# Patient Record
Sex: Female | Born: 1985 | Race: White | Hispanic: No | Marital: Single
Health system: Southern US, Community
[De-identification: ages and names within clinical notes are randomized; demographics above are authoritative.]

---

## 2009-06-17 ENCOUNTER — Ambulatory Visit: Payer: Self-pay | Admitting: Advanced Practice Midwife

## 2009-07-01 ENCOUNTER — Ambulatory Visit: Payer: Self-pay | Admitting: Obstetrics and Gynecology

## 2009-07-15 ENCOUNTER — Ambulatory Visit: Payer: Self-pay | Admitting: Obstetrics and Gynecology

## 2009-07-17 ENCOUNTER — Ambulatory Visit (HOSPITAL_COMMUNITY): Admission: RE | Admit: 2009-07-17 | Discharge: 2009-07-17 | Payer: Self-pay | Admitting: Obstetrics & Gynecology

## 2009-07-17 IMAGING — US US OB TRANSVAGINAL
1 series · 14 of 25 positions shown · non-contrast
Comparison: none

OBSTETRICAL ULTRASOUND:
 This ultrasound exam was performed in the [HOSPITAL] Ultrasound Department.  The OB US report was generated in the AS system, and faxed to the ordering physician.  This report is also available in [HOSPITAL]?s [employer] and in [REDACTED] PACS.

[Series 1: us ob transvaginal · 14 of 25 slices shown]
[im 1/25]
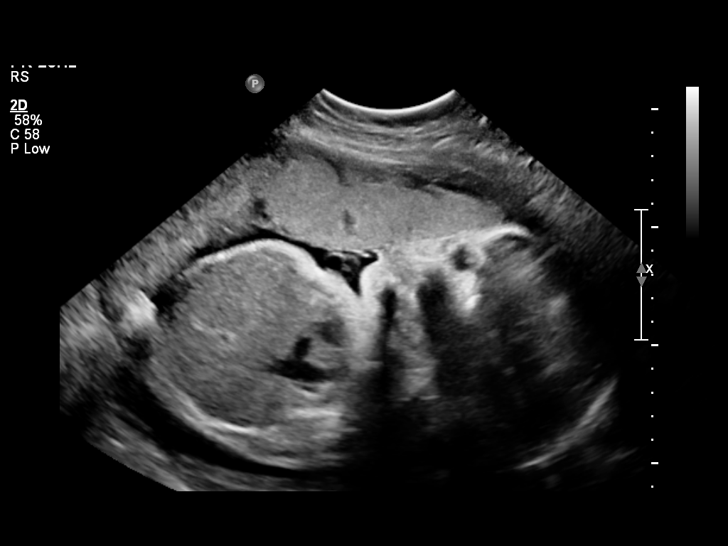
[im 3/25]
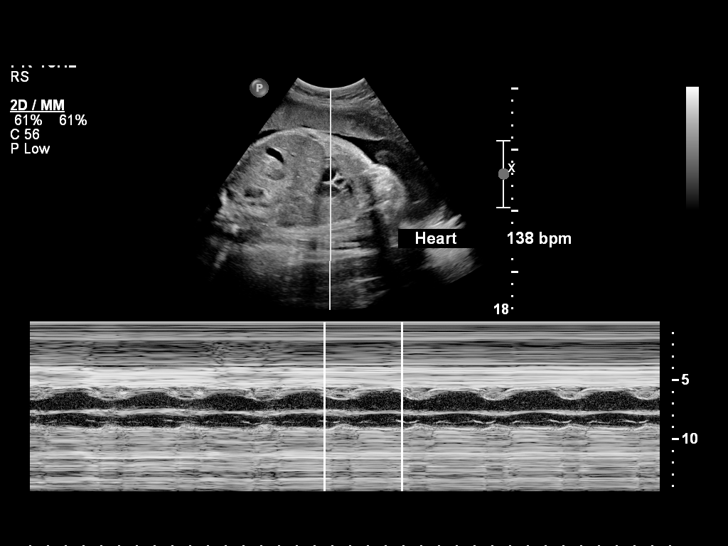
[im 5/25]
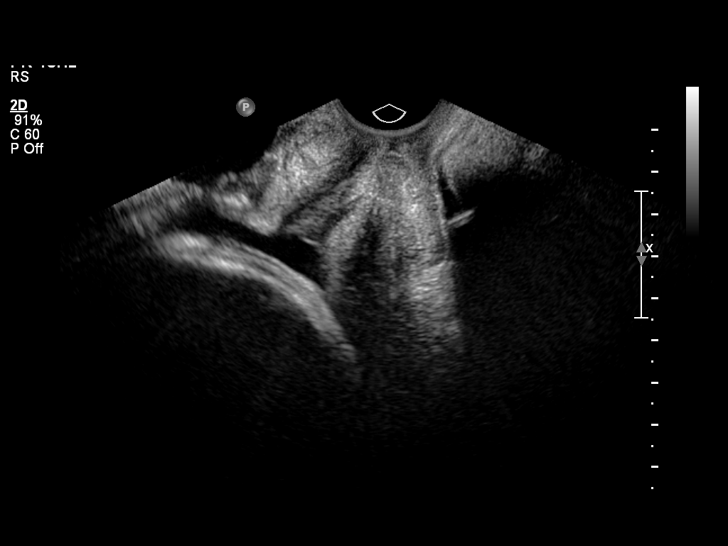
[im 7/25]
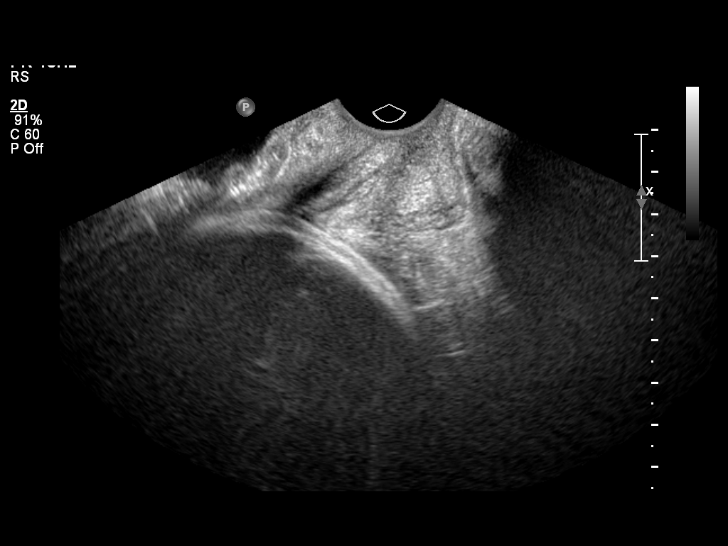
[im 9/25]
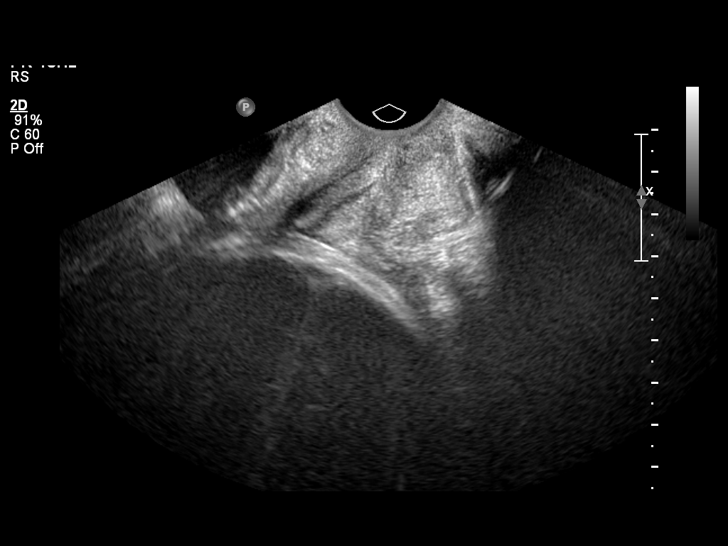
[im 10/25]
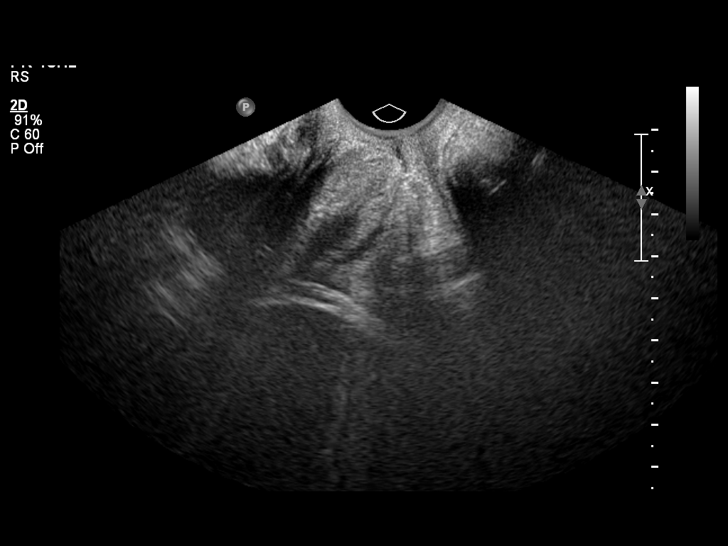
[im 12/25]
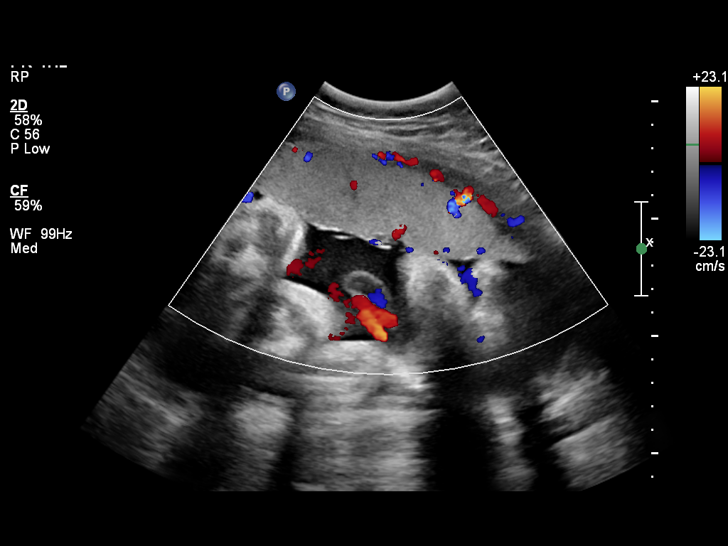
[im 14/25]
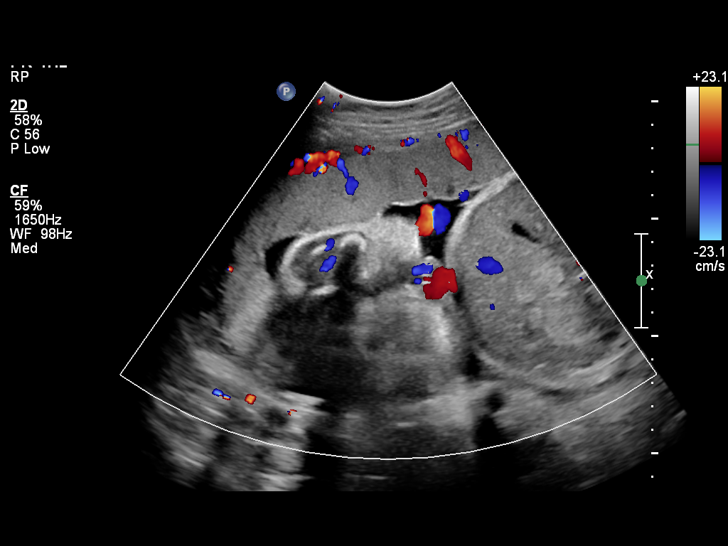
[im 16/25]
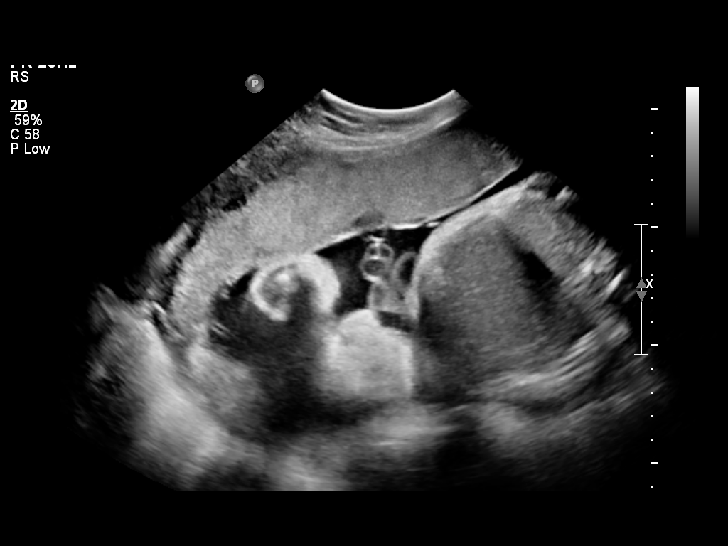
[im 17/25]
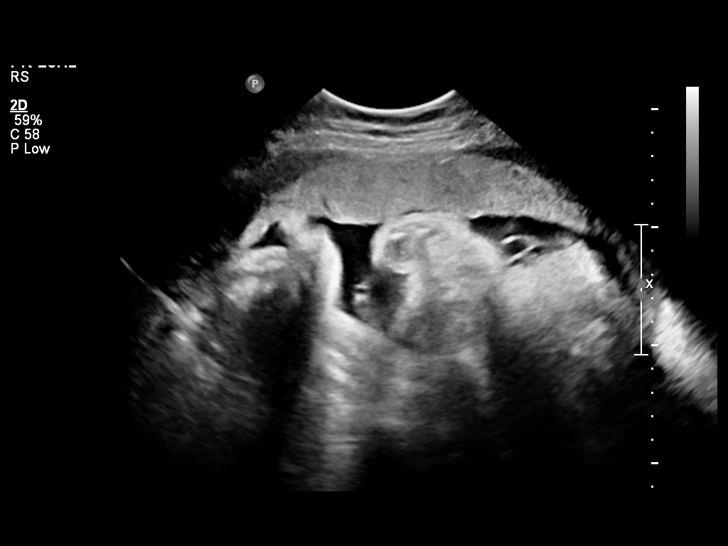
[im 19/25]
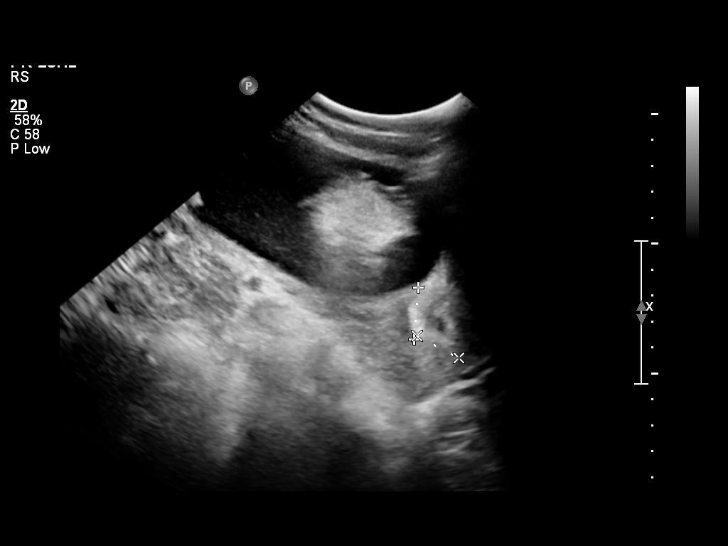
[im 21/25]
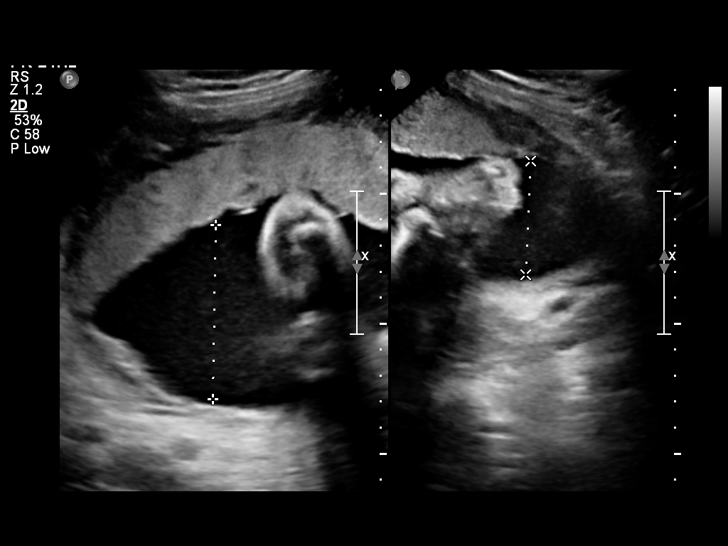
[im 23/25]
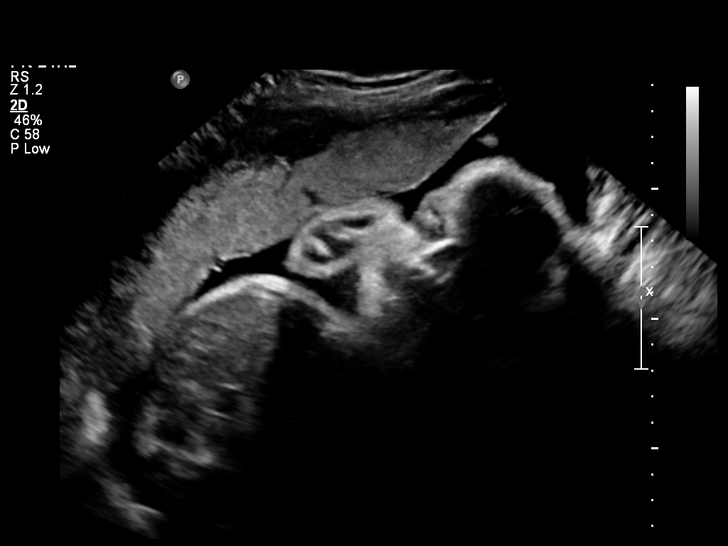
[im 25/25]
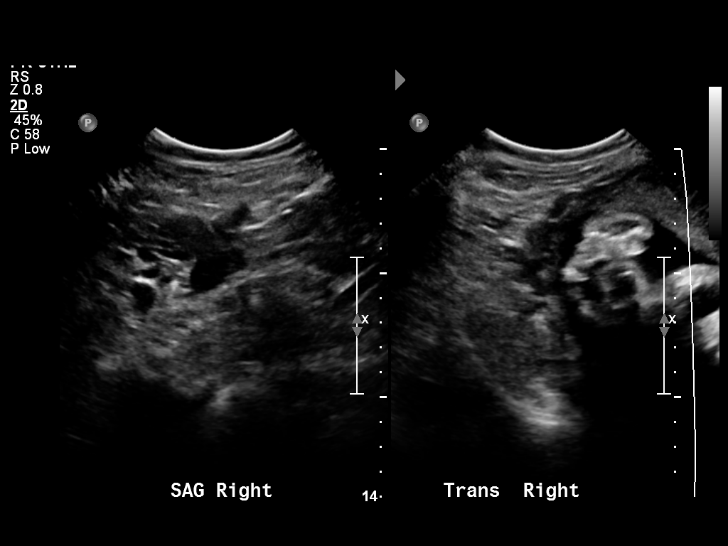

[14 of 25 positions shown; findings below may reference images not displayed]

IMPRESSION: See AS Obstetric US report.

## 2009-07-28 ENCOUNTER — Inpatient Hospital Stay (HOSPITAL_COMMUNITY): Admission: AD | Admit: 2009-07-28 | Discharge: 2009-07-28 | Payer: Self-pay | Admitting: Obstetrics & Gynecology

## 2009-07-30 ENCOUNTER — Ambulatory Visit: Payer: Self-pay | Admitting: Obstetrics & Gynecology

## 2009-08-12 ENCOUNTER — Ambulatory Visit: Payer: Self-pay | Admitting: Physician Assistant

## 2009-08-19 ENCOUNTER — Ambulatory Visit: Payer: Self-pay | Admitting: Family

## 2009-08-19 LAB — CONVERTED CEMR LAB: GC Probe Amp, Urine: NEGATIVE

## 2009-08-26 ENCOUNTER — Ambulatory Visit: Payer: Self-pay | Admitting: Advanced Practice Midwife

## 2009-09-02 ENCOUNTER — Ambulatory Visit: Payer: Self-pay | Admitting: Physician Assistant

## 2009-09-03 ENCOUNTER — Ambulatory Visit: Payer: Self-pay | Admitting: Obstetrics & Gynecology

## 2009-09-03 ENCOUNTER — Inpatient Hospital Stay (HOSPITAL_COMMUNITY): Admission: AD | Admit: 2009-09-03 | Discharge: 2009-09-07 | Payer: Self-pay | Admitting: Family Medicine

## 2009-10-08 ENCOUNTER — Ambulatory Visit: Payer: Self-pay | Admitting: Obstetrics & Gynecology

## 2009-10-09 ENCOUNTER — Encounter: Payer: Self-pay | Admitting: Obstetrics & Gynecology

## 2009-10-21 ENCOUNTER — Other Ambulatory Visit: Admission: RE | Admit: 2009-10-21 | Discharge: 2009-10-21 | Payer: Self-pay | Admitting: Obstetrics & Gynecology

## 2009-10-21 ENCOUNTER — Ambulatory Visit: Payer: Self-pay | Admitting: Obstetrics and Gynecology

## 2009-10-22 ENCOUNTER — Encounter: Payer: Self-pay | Admitting: Obstetrics and Gynecology

## 2009-10-22 LAB — CONVERTED CEMR LAB
HCT: 38.7 % (ref 36.0–46.0)
Hemoglobin: 12.8 g/dL (ref 12.0–15.0)
RDW: 12.5 % (ref 11.5–15.5)
WBC: 7.8 10*3/uL (ref 4.0–10.5)

## 2010-10-27 LAB — CBC
HCT: 21.4 % — ABNORMAL LOW (ref 36.0–46.0)
HCT: 36.2 % (ref 36.0–46.0)
Hemoglobin: 12.2 g/dL (ref 12.0–15.0)
Hemoglobin: 7.2 g/dL — ABNORMAL LOW (ref 12.0–15.0)
Hemoglobin: 8.9 g/dL — ABNORMAL LOW (ref 12.0–15.0)
MCHC: 33.6 g/dL (ref 30.0–36.0)
MCHC: 33.8 g/dL (ref 30.0–36.0)
MCHC: 34 g/dL (ref 30.0–36.0)
MCHC: 34.2 g/dL (ref 30.0–36.0)
MCV: 90.6 fL (ref 78.0–100.0)
MCV: 91.1 fL (ref 78.0–100.0)
MCV: 91.1 fL (ref 78.0–100.0)
MCV: 92.2 fL (ref 78.0–100.0)
Platelets: 157 10*3/uL (ref 150–400)
Platelets: 206 10*3/uL (ref 150–400)
RBC: 2.15 MIL/uL — ABNORMAL LOW (ref 3.87–5.11)
RBC: 2.88 MIL/uL — ABNORMAL LOW (ref 3.87–5.11)
RDW: 12.4 % (ref 11.5–15.5)
RDW: 12.9 % (ref 11.5–15.5)
WBC: 21.6 10*3/uL — ABNORMAL HIGH (ref 4.0–10.5)

## 2010-10-27 LAB — CROSSMATCH

## 2010-11-10 LAB — CBC
Hemoglobin: 11.8 g/dL — ABNORMAL LOW (ref 12.0–15.0)
Platelets: 295 10*3/uL (ref 150–400)
RDW: 12.6 % (ref 11.5–15.5)

## 2010-11-10 LAB — KLEIHAUER-BETKE STAIN
Fetal Cells %: 0 %
Quantitation Fetal Hemoglobin: 0 mL

## 2010-11-10 LAB — ABO/RH: ABO/RH(D): A POS

## 2010-12-23 NOTE — Assessment & Plan Note (Signed)
Sandy Keith, Sandy Keith               ACCOUNT NO.:  [id number]   MEDICAL RECORD NO.:  [id number]          PATIENT TYPE:  POB   LOCATION:  [facility] at [place]         FACILITY:  [facility]   PHYSICIAN:  Elsie Lincoln, MD      DATE OF BIRTH:  January 10, 1986   DATE OF SERVICE:  10/08/2009                                  CLINIC NOTE   The patient is a 26 year old female who presents for her symptoms of  urinary tract infection.  Most of it sounds like pressure and possibly  some urethral irritation.  Dip UA is negative, we will send it for  culture.  She is not really having frequency.  The patient is 4 weeks  postpartum from a long labor.  She also had postpartum endometritis with  an episiotomy.  She had a postpartum hemorrhage and received several  units of packed red blood cells.  The patient is breast feeding and  doing reasonably well with that.  The patient is also complaining of a  rash on her upper extremities.  This appeared during her pregnancy.  She  does not do anything for this.  The patient wants a Dermatology referral  while she has Medicaid.  I will gladly honor this.  I am also going to  prescribe her triamcinolone cream to apply twice a day while she is  waiting for the Dermatology referral.  The patient is still symptomatic.  I will have her come back on Friday.  We will do a detailed speculum  exam, possibly send cultures of the urethra.  Again, the urine is being  sent for culture as well.           ______________________________  Elsie Lincoln, MD     KL/MEDQ  D:  10/08/2009  T:  10/09/2009  Job:  [id number]

## 2010-12-23 NOTE — Assessment & Plan Note (Signed)
NAMEMARJORIE, DEPREY               ACCOUNT NO.:  [id number]   MEDICAL RECORD NO.:  [id number]          PATIENT TYPE:  POB   LOCATION:  [facility] at [facility]         FACILITY:  [facility]   PHYSICIAN:  Caren Griffins, CNM       DATE OF BIRTH:  [birth year]   DATE OF SERVICE:                                  CLINIC NOTE   REASON FOR VISIT:  A 6-week postpartum check.   HISTORY:  Sargun is G1, P1-0-0-1, who had a vacuum-assisted vaginal  delivery on September 05, 2009.  Baby Randie is now doing well on breast  and bottle and weighs nearly 8 pounds.  She had a 2-week stay due to  jaundice.  When she went home, bilirubin was 15; however, the next day,  it spiked up to 24 and the baby was admitted at [facility] for  another week.  Due to this, she has been supplementing with bottle-  feeding as well as breast-feeding the entire time.  Today, she has  questions about why she had the postpartum hemorrhage and is concerned  that that may happen again.  She has had no orthostatic symptoms and is  taking her vitamins and iron supplementation as well.  She has not been  sexually active.  She has seen the baby's father who left her during the  pregnancy and his mother has been very helpful.  She has moved back in  with her mother and stepfather who are also helping her with child care.  She describes nonpruritic rash which consists of scattered small papules  on upper and lower extremities and buttocks, not on the trunk, it is not  pruritic.  She did go to the dermatologist after Dr. Penne Lash referred  her and the diagnosis was keratosis follicularis.  She was told not to  use steroid cream and given another topical cream which she says has not  been effective.  She states that the dermatologist was wanting to do  salicylic acid treatment after she stops breast-feeding.  She is  describing a pinching sensation that is fleeting and located in the area  of her clitoris, it is not related to urinating, and  she has no dysuria  and of note, her urine culture from 2 weeks ago was negative.  She is  not having any pinching or pain now.  She does notice it when she moves  or sometimes when she is straining with urination, but also randomly.  She had a very scanty period of about 3 days last week.  She would like  to go on oral contraceptives.   PHYSICAL EXAMINATION:  VITAL SIGNS:  Blood pressure 117/58, pulse 68,  weight 112.  GENERAL:  Pleasant, in NAD.  BREASTS:  Soft, not inflamed.  ABDOMEN:  Soft, flat, nontender.  PELVIC:  NEFG.  BUS negative.  The urethral meatus is not inflamed.  There is no tenderness in any area with Q-tip pressure.  Her episiotomy  is well healed.  SPECULUM:  Scant physiologic discharge.  Cervix parous, no lesions.  Pap  smear is done, slightly friable with Pap brush with cytobrush.  Uterus,  NSSP, nontender.  No adnexal tenderness or masses.  ASSESSMENT:  1. Normal postpartum.  2. Keratosis follicularis.   PLAN:  CBC is done today.  If her hemoglobin is normal, she can stop  taking the iron supplements.  Prescription given for [facility] OCPs.  Pap  smear sent and records from Dermatology are also sent for.  We discussed  at length her delivery and birth experience, answering all questions and  explaining she had a prolonged labor, putting her at a higher likelihood  of atony in addition to the chorioamnionitis.  There was a small amount  of tissue obtained on manual removal after placenta was delivered.  Also, discussed the baby too was at risk for jaundice due to the vacuum  possibly, and she states the baby's blood type was the same as hers,  both A positive and that the baby was feeding well throughout, so  emphasized that both mother and baby are doing well at this point and  reassured her that in all likelihood, she would not have such a  postpartum hemorrhage in the future and that measures will be taken to  prevent that delivery.  We will see her back  in about 3 months to see  how she is doing on the OCPs and with her family issues.           ______________________________  Caren Griffins, CNM     DP/MEDQ  D:  10/21/2009  T:  10/22/2009  Job:  [id number]

## 2019-03-20 ENCOUNTER — Telehealth: Payer: Self-pay

## 2019-03-20 NOTE — Telephone Encounter (Signed)
Error
